# Patient Record
Sex: Male | Born: 1940 | Race: White | Hispanic: No | Marital: Married | Smoking: Former smoker
Health system: Southern US, Community
[De-identification: ages and names within clinical notes are randomized; demographics above are authoritative.]

## PROBLEM LIST (undated history)

## (undated) DIAGNOSIS — I10 Essential (primary) hypertension: Secondary | ICD-10-CM

## (undated) DIAGNOSIS — E785 Hyperlipidemia, unspecified: Secondary | ICD-10-CM

## (undated) HISTORY — DX: Essential (primary) hypertension: I10

## (undated) HISTORY — DX: Hyperlipidemia, unspecified: E78.5

## (undated) HISTORY — PX: BLADDER SURGERY: SHX569

## (undated) HISTORY — PX: RETINAL DETACHMENT SURGERY: SHX105

## (undated) HISTORY — PX: CHOLECYSTECTOMY: SHX55

## (undated) HISTORY — PX: HERNIA REPAIR: SHX51

## (undated) HISTORY — PX: APPENDECTOMY: SHX54

## (undated) HISTORY — PX: FOOT SURGERY: SHX648

---

## 2017-06-18 ENCOUNTER — Ambulatory Visit (INDEPENDENT_AMBULATORY_CARE_PROVIDER_SITE_OTHER): Payer: Medicare Other | Admitting: Vascular Surgery

## 2017-06-18 ENCOUNTER — Encounter (INDEPENDENT_AMBULATORY_CARE_PROVIDER_SITE_OTHER): Payer: Self-pay | Admitting: Vascular Surgery

## 2017-06-18 VITALS — BP 124/82 | HR 88 | Resp 17 | Ht 68.0 in | Wt 159.0 lb

## 2017-06-18 DIAGNOSIS — I83811 Varicose veins of right lower extremities with pain: Secondary | ICD-10-CM | POA: Diagnosis not present

## 2017-06-18 DIAGNOSIS — E785 Hyperlipidemia, unspecified: Secondary | ICD-10-CM | POA: Diagnosis not present

## 2017-06-18 DIAGNOSIS — I10 Essential (primary) hypertension: Secondary | ICD-10-CM | POA: Diagnosis not present

## 2017-06-18 NOTE — Assessment & Plan Note (Signed)
See plan as below 

## 2017-06-18 NOTE — Assessment & Plan Note (Signed)
lipid control important in reducing the progression of atherosclerotic disease. Continue statin therapy  

## 2017-06-18 NOTE — Progress Notes (Signed)
Patient ID: Lance Mcclure, male   DOB: 1941/11/22, 76 y.o.   MRN: [id number]  Chief Complaint  Patient presents with  . New Evaluation    varicose veins    HPI Lance Mcclure is a 76 y.o. male. The patient presents with complaints of symptomatic varicosities of the Right leg. The patient reports a long standing history of varicosities and they have become painful over time. There was no clear inciting event or causative factor that started the symptoms.  The right leg is more severly affected. The patient elevates the legs for relief. The pain is described as stinging and burning particularly around the inside of the knee and medial calf area. The symptoms are generally most severe in the evening, particularly when they have been on their feet for long periods of time. Anti-inflammatories and elevation have been used to try to improve the symptoms with limited success. The patient complains of right leg swelling as an associated symptom. The patient has no previous history of deep venous thrombosis or superficial thrombophlebitis to their knowledge. His left leg really does not have much in way of symptoms and his varicose veins are quite mild.     Past Medical History:  Diagnosis Date  . Hyperlipidemia   . Hypertension     Past Surgical History:  Procedure Laterality Date  . BLADDER SURGERY    . CHOLECYSTECTOMY    . FOOT SURGERY Left   . HERNIA REPAIR    . RETINAL DETACHMENT SURGERY      Family History  Problem Relation Age of Onset  . Cancer Mother   . Stroke Father     Social History Social History  Substance Use Topics  . Smoking status: Former Games developer  . Smokeless tobacco: Never Used  . Alcohol use No    No Known Allergies  Current Outpatient Prescriptions  Medication Sig Dispense Refill  . Biotin 5 MG TABS Take by mouth daily.    . calcium citrate-vitamin D (CITRACAL+D) 315-200 MG-UNIT tablet Take 1 tablet by mouth daily.     Marland Kitchen co-enzyme Q-10 30 MG capsule Take  200 mg by mouth daily.    . Cyanocobalamin (VITAMIN B 12 PO) Take by mouth daily.    Marland Kitchen glucosamine-chondroitin 500-400 MG tablet Take 1 tablet by mouth daily.     Marland Kitchen lisinopril (PRINIVIL,ZESTRIL) 40 MG tablet Take 40 mg by mouth daily.    . Multiple Vitamins-Minerals (MULTIVITAMIN ADULTS 50+ PO) Take by mouth daily.    . Omega-3 Fatty Acids (FISH OIL) 1200 MG CAPS Take by mouth daily.    . simvastatin (ZOCOR) 20 MG tablet Take 20 mg by mouth at bedtime.     No current facility-administered medications for this visit.       REVIEW OF SYSTEMS (Negative unless checked)  Constitutional: [] Weight loss  [] Fever  [] Chills Cardiac: [] Chest pain   [] Chest pressure   [] Palpitations   [] Shortness of breath when laying flat   [] Shortness of breath at rest   [] Shortness of breath with exertion. Vascular:  [] Pain in legs with walking   [] Pain in legs at rest   [] Pain in legs when laying flat   [] Claudication   [] Pain in feet when walking  [] Pain in feet at rest  [] Pain in feet when laying flat   [] History of DVT   [] Phlebitis   [x] Swelling in legs   [x] Varicose veins   [] Non-healing ulcers Pulmonary:   [] Uses home oxygen   [] Productive cough   [] Hemoptysis   []   Wheeze  [] COPD   [] Asthma Neurologic:  [] Dizziness  [] Blackouts   [] Seizures   [] History of stroke   [] History of TIA  [] Aphasia   [] Temporary blindness   [] Dysphagia   [] Weakness or numbness in arms   [] Weakness or numbness in legs Musculoskeletal:  [] Arthritis   [] Joint swelling   [] Joint pain   [] Low back pain Hematologic:  [] Easy bruising  [] Easy bleeding   [] Hypercoagulable state   [] Anemic  [] Hepatitis Gastrointestinal:  [] Blood in stool   [] Vomiting blood  [] Gastroesophageal reflux/heartburn   [] Abdominal pain Genitourinary:  [] Chronic kidney disease   [] Difficult urination  [] Frequent urination  [] Burning with urination   [] Hematuria Skin:  [] Rashes   [] Ulcers   [] Wounds Psychological:  [] History of anxiety   []  History of major  depression.    Physical Exam BP 124/82   Pulse 88   Resp 17   Ht 5\' 8"  (1.727 m)   Wt 72.1 kg (159 lb)   BMI 24.18 kg/m  Gen:  WD/WN, NAD. Appears younger than stated age Head: [place]/AT, No temporalis wasting.  Ear/Nose/Throat: Hearing grossly intact, dentition Good Eyes: Sclera non-icteric. Conjunctiva clear Neck: Supple, no nuchal rigidity. Trachea midline Pulmonary:  Good air movement, no use of accessory muscles, respirations not labored.  Cardiac: RRR, No JVD Vascular: Varicosities extensive and measuring up to 4 mm in the right lower extremity        Varicosities scattered and measuring up to 2 mm in the left lower extremity Vessel Right Left  Radial Palpable Palpable  Ulnar Palpable Palpable  Brachial Palpable Palpable  Carotid Palpable, without bruit Palpable, without bruit  Aorta Not palpable N/A  Femoral Palpable Palpable  Popliteal Palpable Palpable  PT Palpable Palpable  DP Palpable Palpable    Musculoskeletal: M/S 5/5 throughout.   1 + RLE edema.  No LLE edema Neurologic: Sensation grossly intact in extremities.  Symmetrical.  Speech is fluent.  Psychiatric: Judgment intact, Mood & affect appropriate for pt's clinical situation. Dermatologic: No rashes or ulcers noted.  No cellulitis or open wounds.    Radiology No results found.  Labs No results found for this or any previous visit (from the past 2160 hour(s)).  Assessment/Plan:  Essential hypertension, benign blood pressure control important in reducing the progression of atherosclerotic disease. On appropriate oral medications.   Hyperlipidemia lipid control important in reducing the progression of atherosclerotic disease. Continue statin therapy   Varicose veins of leg with pain, right See plan as below    The patient has symptoms consistent with chronic venous insufficiency. We discussed the natural history and treatment options for venous disease. I recommended the regular use of 20 - 30 mm  Hg compression stockings, and prescribed these today. I recommended leg elevation and anti-inflammatories as needed for pain. I have also recommended a complete venous duplex to assess the venous system for reflux or thrombotic issues. This can be done at the patient's convenience. I will see the patient back in 3 months to assess the response to conservative management, and determine further treatment options.     Festus BarrenJason Elio Haden 06/18/2017, 9:41 AM   This note was created with Dragon medical transcription system.  Any errors from dictation are unintentional.

## 2017-06-18 NOTE — Assessment & Plan Note (Signed)
blood pressure control important in reducing the progression of atherosclerotic disease. On appropriate oral medications.  

## 2017-09-20 ENCOUNTER — Ambulatory Visit (INDEPENDENT_AMBULATORY_CARE_PROVIDER_SITE_OTHER): Payer: Medicare Other | Admitting: Vascular Surgery

## 2017-09-20 ENCOUNTER — Encounter (INDEPENDENT_AMBULATORY_CARE_PROVIDER_SITE_OTHER): Payer: Medicare Other

## 2017-11-05 ENCOUNTER — Ambulatory Visit (INDEPENDENT_AMBULATORY_CARE_PROVIDER_SITE_OTHER): Payer: Medicare Other

## 2017-11-05 ENCOUNTER — Encounter (INDEPENDENT_AMBULATORY_CARE_PROVIDER_SITE_OTHER): Payer: Self-pay | Admitting: Vascular Surgery

## 2017-11-05 ENCOUNTER — Encounter (INDEPENDENT_AMBULATORY_CARE_PROVIDER_SITE_OTHER): Payer: Self-pay

## 2017-11-05 ENCOUNTER — Ambulatory Visit (INDEPENDENT_AMBULATORY_CARE_PROVIDER_SITE_OTHER): Payer: Medicare Other | Admitting: Vascular Surgery

## 2017-11-05 VITALS — BP 125/70 | HR 54 | Resp 16 | Wt 160.0 lb

## 2017-11-05 DIAGNOSIS — I10 Essential (primary) hypertension: Secondary | ICD-10-CM | POA: Diagnosis not present

## 2017-11-05 DIAGNOSIS — E785 Hyperlipidemia, unspecified: Secondary | ICD-10-CM | POA: Diagnosis not present

## 2017-11-05 DIAGNOSIS — I83811 Varicose veins of right lower extremities with pain: Secondary | ICD-10-CM

## 2017-11-05 NOTE — Patient Instructions (Signed)
Varicose Vein Surgery, Care After Refer to this sheet in the next few weeks. These instructions provide you with information about caring for yourself after your procedure. Your health care provider may also give you more specific instructions. Your treatment has been planned according to current medical practices, but problems sometimes occur. Call your health care provider if you have any problems or questions after your procedure. What can I expect after the procedure? After your procedure, it is typical to have the following:  Swelling.  Bruising.  Soreness.  Mild skin discoloration.  Slight bleeding at incision sites.  Follow these instructions at home:  Take medicines only as directed by your health care provider.  Wear compression stockings as directed by your health care provider. These stockings help to prevent blood clots and reduce swelling in your legs.  There are many different ways to close and cover an incision, including stitches (sutures), skin glue, and adhesive strips. Follow your health care provider's instructions on: ? Incision care. ? Bandage (dressing) changes and removal. ? Incision closure removal.  Wear loose-fitting clothing.  Get regular daily exercise. Walk or ride a stationary bike daily or as directed by your health care provider.  Ask your health care provider when you can return to work. This may depend on the type of work you do.  Be patient with your recovery. It can take up to 4 weeks to get back to your usual activities. Contact a health care provider if:  You have a fever.  You have drainage, redness, swelling, or pain at an incision site.  You develop a cough. Get help right away if:  You pass out.  You have very bad pain in your leg.  You have leg pain that gets worse when you walk.  You have redness or swelling in your leg that is getting worse.  You have trouble breathing.  You cough up blood. This information is not  intended to replace advice given to you by your health care provider. Make sure you discuss any questions you have with your health care provider. Document Released: 08/06/2014 Document Revised: 05/10/2016 Document Reviewed: 05/12/2014 Elsevier Interactive Patient Education  2018 [employer] Inc[employer]  

## 2017-11-05 NOTE — Progress Notes (Signed)
Patient ID: Lance Mcclure, male   DOB: [birth year], 76 y.o.   MRN: [id number]  Chief Complaint  Patient presents with  . Follow-up    22mo bil ven reflux    HPI Lance Mcclure is a 76 y.o. male.  Patient returns in follow up of their venous disease.  They have done their best to comply with the prescribed conservative therapies of compression stockings, leg elevation, exercise, and still requires anti-inflammatories for discomfort and has symptoms that are persistent and bothersome on a daily basis, affecting their activities of daily living and normal activities.  The venous reflux study demonstrates no DVT or superficial thrombophlebitis.  Right great saphenous vein reflux was seen throughout the leg.         Past Medical History:  Diagnosis Date  . Hyperlipidemia   . Hypertension          Past Surgical History:  Procedure Laterality Date  . BLADDER SURGERY    . CHOLECYSTECTOMY    . FOOT SURGERY Left   . HERNIA REPAIR    . RETINAL DETACHMENT SURGERY           Family History  Problem Relation Age of Onset  . Cancer Mother   . Stroke Father     Social History     Social History  Substance Use Topics  . Smoking status: Former Games developermoker  . Smokeless tobacco: Never Used  . Alcohol use No    No Known Allergies        Current Outpatient Prescriptions  Medication Sig Dispense Refill  . Biotin 5 MG TABS Take by mouth daily.    . calcium citrate-vitamin D (CITRACAL+D) 315-200 MG-UNIT tablet Take 1 tablet by mouth daily.     Marland Kitchen. co-enzyme Q-10 30 MG capsule Take 200 mg by mouth daily.    . Cyanocobalamin (VITAMIN B 12 PO) Take by mouth daily.    Marland Kitchen. glucosamine-chondroitin 500-400 MG tablet Take 1 tablet by mouth daily.     Marland Kitchen. lisinopril (PRINIVIL,ZESTRIL) 40 MG tablet Take 40 mg by mouth daily.    . Multiple Vitamins-Minerals (MULTIVITAMIN ADULTS 50+ PO) Take by mouth daily.    . Omega-3 Fatty Acids (FISH OIL) 1200 MG CAPS Take by mouth  daily.    . simvastatin (ZOCOR) 20 MG tablet Take 20 mg by mouth at bedtime.     No current facility-administered medications for this visit.       REVIEW OF SYSTEMS (Negative unless checked)  Constitutional: [] Weight loss  [] Fever  [] Chills Cardiac: [] Chest pain   [] Chest pressure   [] Palpitations   [] Shortness of breath when laying flat   [] Shortness of breath at rest   [] Shortness of breath with exertion. Vascular:  [] Pain in legs with walking   [] Pain in legs at rest   [] Pain in legs when laying flat   [] Claudication   [] Pain in feet when walking  [] Pain in feet at rest  [] Pain in feet when laying flat   [] History of DVT   [] Phlebitis   [x] Swelling in legs   [x] Varicose veins   [] Non-healing ulcers Pulmonary:   [] Uses home oxygen   [] Productive cough   [] Hemoptysis   [] Wheeze  [] COPD   [] Asthma Neurologic:  [] Dizziness  [] Blackouts   [] Seizures   [] History of stroke   [] History of TIA  [] Aphasia   [] Temporary blindness   [] Dysphagia   [] Weakness or numbness in arms   [] Weakness or numbness in legs Musculoskeletal:  [] Arthritis   [] Joint swelling   []   Joint pain   [] Low back pain Hematologic:  [] Easy bruising  [] Easy bleeding   [] Hypercoagulable state   [] Anemic  [] Hepatitis Gastrointestinal:  [] Blood in stool   [] Vomiting blood  [] Gastroesophageal reflux/heartburn   [] Abdominal pain Genitourinary:  [] Chronic kidney disease   [] Difficult urination  [] Frequent urination  [] Burning with urination   [] Hematuria Skin:  [] Rashes   [] Ulcers   [] Wounds Psychological:  [] History of anxiety   []  History of major depression.       Physical Exam BP 125/70 (BP Location: Right Arm)   Pulse (!) 54   Resp 16   Wt 72.6 kg (160 lb)   BMI 24.33 kg/m  Gen:  WD/WN, NAD Head: [place]/AT, No temporalis wasting.  Ear/Nose/Throat: Hearing grossly intact, dentition good Eyes: Sclera non-icteric. Conjunctiva clear Neck: Supple. Trachea midline Pulmonary:  Good air movement, no use of accessory  muscles, respirations not labored.  Cardiac: RRR, No JVD Vascular: Varicosities diffuse and measuring up to 3 mm in the right lower extremity particularly in the medial knee and posterior calf area        Varicosities scattered and measuring up to 1-2 mm in the left lower extremity Vessel Right Left  Radial Palpable Palpable  Ulnar Palpable Palpable  Brachial Palpable Palpable  Carotid Palpable, without bruit Palpable, without bruit  Aorta Not palpable N/A  Femoral Palpable Palpable  Popliteal Palpable Palpable  PT Palpable Palpable  DP Palpable Palpable    Musculoskeletal: M/S 5/5 throughout.   Trace RLE edema.  No LLE edema Neurologic: Sensation grossly intact in extremities.  Symmetrical.  Speech is fluent.  Psychiatric: Judgment intact, Mood & affect appropriate for pt's clinical situation. Dermatologic: No rashes or ulcers noted.  No cellulitis or open wounds.    Radiology No results found.  Labs No results found for this or any previous visit (from the past 2160 hour(s)).  Assessment/Plan: Essential hypertension, benign blood pressure control important in reducing the progression of atherosclerotic disease. On appropriate oral medications.   Hyperlipidemia lipid control important in reducing the progression of atherosclerotic disease. Continue statin therapy    Varicose veins of leg with pain, right     The patient has done their best to comply with conservative therapy of 20-30 mm Hg compression stockings, leg elevation, exercise, and anti-inflammatories as needed for discomfort.  Despite this, they continue to have daily and persistent symptoms from their venous disease.  A venous reflux study demonstrates no DVT or superficial thrombophlebitis.  Right great saphenous vein reflux was seen throughout the leg.  As such, the patient is likely to benefit from endovenous laser ablation of the right great saphenous vein.  Risks and benefits of the procedure including  bleeding, infection, recanalization, DVT, and need for further therapy for residual varicosities were discussed.  The patient voices their understanding and is agreeable to proceed with right great saphenous vein.     Festus BarrenJason Dew 11/05/2017, 11:44 AM

## 2018-02-18 ENCOUNTER — Encounter (INDEPENDENT_AMBULATORY_CARE_PROVIDER_SITE_OTHER): Payer: Self-pay | Admitting: Vascular Surgery

## 2018-02-18 ENCOUNTER — Ambulatory Visit (INDEPENDENT_AMBULATORY_CARE_PROVIDER_SITE_OTHER): Payer: Medicare Other | Admitting: Vascular Surgery

## 2018-02-18 VITALS — BP 113/64 | HR 50 | Resp 16 | Wt 160.0 lb

## 2018-02-18 DIAGNOSIS — I83811 Varicose veins of right lower extremities with pain: Secondary | ICD-10-CM

## 2018-02-18 NOTE — Progress Notes (Signed)
Varicose veins of leg with pain, right    The patient's right lower extremity was sterilely prepped and draped. The ultrasound machine was used to visualize the saphenous vein throughout its course. A segment around the knee was selected for access. The saphenous vein was accessed at the level of the knee difficulty using ultrasound guidance with a micro puncture needle. A micro puncture wire and sheath were then placed. A 0.018 wire was placed beyond the saphenofemoral junction through the sheath and the micro puncture sheath was removed. The 65 cm sheath was then placed over the wire and the wire and dilator were removed. The laser fiber was placed through the sheath and its tip was placed approximately 4 cm below the saphenofemoral junction. Tumescent anesthesia was then created with a dilute lidocaine solution. Laser energy was then delivered with constant withdrawal of the sheath and laser fiber. Approximately 1159 Joules of energy were delivered over a length of 28 cm using the 1470 Hz [employer] machine at [employer]. Sterile dressings were placed. The patient tolerated the procedure well without complications.

## 2018-02-21 ENCOUNTER — Ambulatory Visit (INDEPENDENT_AMBULATORY_CARE_PROVIDER_SITE_OTHER): Payer: Medicare Other

## 2018-02-21 DIAGNOSIS — I83811 Varicose veins of right lower extremities with pain: Secondary | ICD-10-CM | POA: Diagnosis not present

## 2018-03-14 ENCOUNTER — Ambulatory Visit (INDEPENDENT_AMBULATORY_CARE_PROVIDER_SITE_OTHER): Payer: Medicare Other | Admitting: Vascular Surgery

## 2018-03-14 ENCOUNTER — Encounter (INDEPENDENT_AMBULATORY_CARE_PROVIDER_SITE_OTHER): Payer: Self-pay | Admitting: Vascular Surgery

## 2018-03-14 VITALS — BP 152/84 | HR 66 | Resp 12 | Ht 67.0 in | Wt 159.0 lb

## 2018-03-14 DIAGNOSIS — I890 Lymphedema, not elsewhere classified: Secondary | ICD-10-CM | POA: Diagnosis not present

## 2018-03-14 DIAGNOSIS — I83811 Varicose veins of right lower extremities with pain: Secondary | ICD-10-CM | POA: Diagnosis not present

## 2018-03-14 DIAGNOSIS — I872 Venous insufficiency (chronic) (peripheral): Secondary | ICD-10-CM | POA: Insufficient documentation

## 2018-03-14 NOTE — Progress Notes (Signed)
Subjective:    Patient ID: Lance Mcclure, male    DOB: 1941-05-23, 77 y.o.   MRN: [id number] Chief Complaint  Patient presents with  . Follow-up    3-4 week post laser   The patient presents for his first post procedure follow-up.  The patient is status post right greater saphenous vein ablation on February 18, 2018.  The patient is still experiencing some soreness to the right lower extremity.  Since his initial visit, the patient has been engaging in conservative therapy included wearing medical grade 1 compression socks and elevating his legs on a daily basis.  The patient has noted some improvement to the discomfort along his varicosities since undergoing laser ablation however the majority of his symptoms do remain.  The patient notes the discomfort along his varicosities have progressed to the point he is unable to function on a daily basis.  The patient notes his symptoms are lifestyle limiting.  The patient denies any fever, nausea vomiting.  Review of Systems  Constitutional: Negative.   HENT: Negative.   Eyes: Negative.   Respiratory: Negative.   Cardiovascular:       Painful varicose veins  Gastrointestinal: Negative.   Endocrine: Negative.   Genitourinary: Negative.   Musculoskeletal: Negative.   Skin: Negative.   Allergic/Immunologic: Negative.   Neurological: Negative.   Hematological: Negative.   Psychiatric/Behavioral: Negative.       Objective:   Physical Exam  Constitutional: He is oriented to person, place, and time. He appears well-developed and well-nourished. No distress.  HENT:  Head: Normocephalic and atraumatic.  Eyes: Pupils are equal, round, and reactive to light. Conjunctivae are normal.  Neck: Normal range of motion.  Cardiovascular: Normal rate, regular rhythm, normal heart sounds and intact distal pulses.  Pulses:      Radial pulses are 2+ on the right side, and 2+ on the left side.       Dorsalis pedis pulses are 2+ on the right side, and 2+ on the  left side.       Posterior tibial pulses are 2+ on the right side, and 2+ on the left side.  Pulmonary/Chest: Effort normal and breath sounds normal.  Musculoskeletal: Normal range of motion. He exhibits edema (Mild right lower extremity edema).  Neurological: He is alert and oriented to person, place, and time.  Skin: He is not diaphoretic.  Mixture of greater than 1 cm and less than 1 cm diffuse varicosities noted to the right lower extremity.  There is mild stasis dermatitis.  There is no skin changes or cellulitis noted.  Psychiatric: He has a normal mood and affect. His behavior is normal. Judgment and thought content normal.  Vitals reviewed.  BP (!) 152/84 (BP Location: Right Arm, Patient Position: Sitting)   Pulse 66   Resp 12   Ht 5\' 7"  (1.702 m)   Wt 159 lb (72.1 kg)   BMI 24.90 kg/m   Past Medical History:  Diagnosis Date  . Hyperlipidemia   . Hypertension    Social History   Socioeconomic History  . Marital status: Married    Spouse name: Not on file  . Number of children: Not on file  . Years of education: Not on file  . Highest education level: Not on file  Occupational History  . Not on file  Social Needs  . Financial resource strain: Not on file  . Food insecurity:    Worry: Not on file    Inability: Not on file  .  Transportation needs:    Medical: Not on file    Non-medical: Not on file  Tobacco Use  . Smoking status: Former Games developermoker  . Smokeless tobacco: Never Used  Substance and Sexual Activity  . Alcohol use: No  . Drug use: No  . Sexual activity: Not on file  Lifestyle  . Physical activity:    Days per week: Not on file    Minutes per session: Not on file  . Stress: Not on file  Relationships  . Social connections:    Talks on phone: Not on file    Gets together: Not on file    Attends religious service: Not on file    Active member of club or organization: Not on file    Attends meetings of clubs or organizations: Not on file     Relationship status: Not on file  . Intimate partner violence:    Fear of current or ex partner: Not on file    Emotionally abused: Not on file    Physically abused: Not on file    Forced sexual activity: Not on file  Other Topics Concern  . Not on file  Social History Narrative  . Not on file   Past Surgical History:  Procedure Laterality Date  . BLADDER SURGERY    . CHOLECYSTECTOMY    . FOOT SURGERY Left   . HERNIA REPAIR    . RETINAL DETACHMENT SURGERY     Family History  Problem Relation Age of Onset  . Cancer Mother   . Stroke Father    No Known Allergies     Assessment & Plan:  The patient presents for his first post procedure follow-up.  The patient is status post right greater saphenous vein ablation on February 18, 2018.  The patient is still experiencing some soreness to the right lower extremity.  Since his initial visit, the patient has been engaging in conservative therapy included wearing medical grade 1 compression socks and elevating his legs on a daily basis.  The patient has noted some improvement to the discomfort along his varicosities since undergoing laser ablation however the majority of his symptoms do remain.  The patient notes the discomfort along his varicosities have progressed to the point he is unable to function on a daily basis.  The patient notes his symptoms are lifestyle limiting.  The patient denies any fever, nausea vomiting.  1. Chronic venous insufficiency - New The patient is status post successful ablation of the right greater saphenous vein on February 18, 2018  2. Lymphedema - New At this time, the patient is engaging in conservative therapy including wearing medical grade 1 compression stockings and elevating his legs on a daily basis The patient may be a candidate for lymphedema pump in the future  3. Varicose veins of leg with pain, right - Stable The patient has not seen a significant improvement to the discomfort along his varicosities to  the right lower extremity status post successful laser ablation to the great saphenous vein. The patient would greatly benefit from saline/foam sclerotherapy into the evening varicosities I will applied to the patient's insurance The patient will continue engaging in conservative therapy until I receive insurance approval  Current Outpatient Medications on File Prior to Visit  Medication Sig Dispense Refill  . Biotin 5 MG TABS Take by mouth daily.    . calcium citrate-vitamin D (CITRACAL+D) 315-200 MG-UNIT tablet Take 1 tablet by mouth daily.     . calcium-vitamin D (OSCAL WITH D)  500-200 MG-UNIT TABS tablet Take by mouth.    . co-enzyme Q-10 30 MG capsule Take 200 mg by mouth daily.    . Cyanocobalamin (VITAMIN B 12 PO) Take by mouth daily.    Marland Kitchen glucosamine-chondroitin 500-400 MG tablet Take 1 tablet by mouth daily.     Marland Kitchen lisinopril (PRINIVIL,ZESTRIL) 40 MG tablet Take 40 mg by mouth daily.    . Multiple Vitamin (MULTI-VITAMINS) TABS Take by mouth.    . Multiple Vitamins-Minerals (MULTIVITAMIN ADULTS 50+ PO) Take by mouth daily.    . Omega-3 Fatty Acids (FISH OIL) 1200 MG CAPS Take by mouth daily.    . simvastatin (ZOCOR) 20 MG tablet Take 20 mg by mouth at bedtime.     No current facility-administered medications on file prior to visit.    There are no Patient Instructions on file for this visit. No follow-ups on file.  Sadie Hazelett A Chavon Lucarelli, PA-C

## 2018-04-14 ENCOUNTER — Encounter (INDEPENDENT_AMBULATORY_CARE_PROVIDER_SITE_OTHER): Payer: Self-pay | Admitting: Vascular Surgery

## 2018-04-14 ENCOUNTER — Ambulatory Visit (INDEPENDENT_AMBULATORY_CARE_PROVIDER_SITE_OTHER): Payer: Medicare Other | Admitting: Vascular Surgery

## 2018-04-14 VITALS — BP 128/62 | HR 62 | Resp 16 | Ht 67.0 in | Wt 160.0 lb

## 2018-04-14 DIAGNOSIS — I83811 Varicose veins of right lower extremities with pain: Secondary | ICD-10-CM

## 2018-04-14 NOTE — Progress Notes (Signed)
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   Procedure: Sclerotherapy using hypertonic saline mixed with 1% Lidocaine was performed on the right lower extremity. Compression wraps were placed. The patient tolerated the procedure well. 

## 2018-05-19 ENCOUNTER — Ambulatory Visit (INDEPENDENT_AMBULATORY_CARE_PROVIDER_SITE_OTHER): Payer: Medicare Other | Admitting: Vascular Surgery

## 2018-05-19 ENCOUNTER — Encounter (INDEPENDENT_AMBULATORY_CARE_PROVIDER_SITE_OTHER): Payer: Self-pay | Admitting: Vascular Surgery

## 2018-05-19 VITALS — BP 154/87 | HR 72 | Resp 13 | Ht 66.5 in | Wt 158.0 lb

## 2018-05-19 DIAGNOSIS — I83811 Varicose veins of right lower extremities with pain: Secondary | ICD-10-CM | POA: Diagnosis not present

## 2018-05-19 NOTE — Progress Notes (Signed)
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   Procedure: Sclerotherapy using hypertonic saline mixed with 1% Lidocaine was performed on the right lower extremity. Compression wraps were placed. The patient tolerated the procedure well. 

## 2018-07-14 ENCOUNTER — Ambulatory Visit (INDEPENDENT_AMBULATORY_CARE_PROVIDER_SITE_OTHER): Payer: Medicare Other | Admitting: Vascular Surgery

## 2018-07-14 ENCOUNTER — Encounter (INDEPENDENT_AMBULATORY_CARE_PROVIDER_SITE_OTHER): Payer: Self-pay

## 2018-07-14 ENCOUNTER — Encounter (INDEPENDENT_AMBULATORY_CARE_PROVIDER_SITE_OTHER): Payer: Self-pay | Admitting: Vascular Surgery

## 2018-07-14 VITALS — BP 140/77 | HR 77 | Resp 16 | Ht 66.5 in | Wt 158.8 lb

## 2018-07-14 DIAGNOSIS — I83811 Varicose veins of right lower extremities with pain: Secondary | ICD-10-CM | POA: Diagnosis not present

## 2018-07-14 NOTE — Progress Notes (Signed)
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   Procedure: Sclerotherapy using hypertonic saline mixed with 1% Lidocaine was performed on the right lower extremity. Compression wraps were placed. The patient tolerated the procedure well. 

## 2019-07-21 ENCOUNTER — Inpatient Hospital Stay
Admission: EM | Admit: 2019-07-21 | Discharge: 2019-07-24 | DRG: 390 | Disposition: A | Discharge status: Home / Self Care | Payer: Medicare Other | Attending: Surgery | Admitting: Surgery

## 2019-07-21 ENCOUNTER — Other Ambulatory Visit: Payer: Self-pay

## 2019-07-21 DIAGNOSIS — Z20828 Contact with and (suspected) exposure to other viral communicable diseases: Secondary | ICD-10-CM | POA: Diagnosis present

## 2019-07-21 DIAGNOSIS — E785 Hyperlipidemia, unspecified: Secondary | ICD-10-CM | POA: Diagnosis present

## 2019-07-21 DIAGNOSIS — K56609 Unspecified intestinal obstruction, unspecified as to partial versus complete obstruction: Secondary | ICD-10-CM | POA: Diagnosis present

## 2019-07-21 DIAGNOSIS — I10 Essential (primary) hypertension: Secondary | ICD-10-CM | POA: Diagnosis present

## 2019-07-21 DIAGNOSIS — Z9049 Acquired absence of other specified parts of digestive tract: Secondary | ICD-10-CM

## 2019-07-21 DIAGNOSIS — I700 Atherosclerosis of aorta: Secondary | ICD-10-CM | POA: Diagnosis present

## 2019-07-21 DIAGNOSIS — Z87891 Personal history of nicotine dependence: Secondary | ICD-10-CM

## 2019-07-21 DIAGNOSIS — Z0189 Encounter for other specified special examinations: Secondary | ICD-10-CM

## 2019-07-21 DIAGNOSIS — Z79899 Other long term (current) drug therapy: Secondary | ICD-10-CM

## 2019-07-21 DIAGNOSIS — K5669 Other partial intestinal obstruction: Secondary | ICD-10-CM | POA: Diagnosis not present

## 2019-07-21 DIAGNOSIS — M47816 Spondylosis without myelopathy or radiculopathy, lumbar region: Secondary | ICD-10-CM | POA: Diagnosis present

## 2019-07-21 LAB — COMPREHENSIVE METABOLIC PANEL
ALT: 22 U/L (ref 0–44)
AST: 25 U/L (ref 15–41)
Albumin: 4.8 g/dL (ref 3.5–5.0)
Alkaline Phosphatase: 38 U/L (ref 38–126)
Anion gap: 14 (ref 5–15)
BUN: 20 mg/dL (ref 8–23)
CO2: 24 mmol/L (ref 22–32)
Calcium: 9.5 mg/dL (ref 8.9–10.3)
Chloride: 102 mmol/L (ref 98–111)
Creatinine, Ser: 1.17 mg/dL (ref 0.61–1.24)
GFR calc Af Amer: >60 mL/min (ref >60)
GFR calc non Af Amer: >60 mL/min (ref >60)
Glucose, Bld: 124 mg/dL — ABNORMAL HIGH (ref 70–99)
Potassium: 4 mmol/L (ref 3.5–5.1)
Sodium: 140 mmol/L (ref 135–145)
Total Bilirubin: 1.1 mg/dL (ref 0.3–1.2)
Total Protein: 7.4 g/dL (ref 6.5–8.1)

## 2019-07-21 LAB — LIPASE, BLOOD: Lipase: 29 U/L (ref 11–51)

## 2019-07-21 LAB — CBC
HCT: 43.3 % (ref 39.0–52.0)
Hemoglobin: 14.8 g/dL (ref 13.0–17.0)
MCH: 31.8 pg (ref 26.0–34.0)
MCHC: 34.2 g/dL (ref 30.0–36.0)
MCV: 93.1 fL (ref 80.0–100.0)
Platelets: 236 10*3/uL (ref 150–400)
RBC: 4.65 MIL/uL (ref 4.22–5.81)
RDW: 13.1 % (ref 11.5–15.5)
WBC: 14.3 10*3/uL — ABNORMAL HIGH (ref 4.0–10.5)
nRBC: 0 % (ref 0.0–0.2)

## 2019-07-21 MED ORDER — ONDANSETRON 4 MG PO TBDP
ORAL_TABLET | ORAL | Status: AC
  Filled 2019-07-21: qty 1

## 2019-07-21 MED ORDER — ONDANSETRON 4 MG PO TBDP
4.0000 mg | ORAL_TABLET | Freq: Once | ORAL | Status: AC | PRN
  Administered 2019-07-21: 22:00:00 4 mg via ORAL

## 2019-07-21 NOTE — ED Triage Notes (Signed)
Pt arrives to ED via POV from home with c/o abdominal pain and N/V since 4pm today. Pt reports the abdominal is generalized without radiation. Pt states the s/x's started after eating at the [employer], having some soup and states he has eaten onions and concerned d/t the recent GI outbreak associated with onions in the news. Pt reports 5 episodes on non-bloody emesis since the s/x's started. Pt reports having his gall bladder and appendix surgically removed in the past.

## 2019-07-22 ENCOUNTER — Inpatient Hospital Stay: Payer: Medicare Other

## 2019-07-22 ENCOUNTER — Other Ambulatory Visit: Payer: Self-pay

## 2019-07-22 ENCOUNTER — Emergency Department: Payer: Medicare Other

## 2019-07-22 DIAGNOSIS — K5669 Other partial intestinal obstruction: Secondary | ICD-10-CM | POA: Diagnosis present

## 2019-07-22 DIAGNOSIS — Z20828 Contact with and (suspected) exposure to other viral communicable diseases: Secondary | ICD-10-CM | POA: Diagnosis present

## 2019-07-22 DIAGNOSIS — K56609 Unspecified intestinal obstruction, unspecified as to partial versus complete obstruction: Secondary | ICD-10-CM | POA: Diagnosis present

## 2019-07-22 DIAGNOSIS — Z79899 Other long term (current) drug therapy: Secondary | ICD-10-CM | POA: Diagnosis not present

## 2019-07-22 DIAGNOSIS — Z9049 Acquired absence of other specified parts of digestive tract: Secondary | ICD-10-CM | POA: Diagnosis not present

## 2019-07-22 DIAGNOSIS — I700 Atherosclerosis of aorta: Secondary | ICD-10-CM | POA: Diagnosis present

## 2019-07-22 DIAGNOSIS — M47816 Spondylosis without myelopathy or radiculopathy, lumbar region: Secondary | ICD-10-CM | POA: Diagnosis present

## 2019-07-22 DIAGNOSIS — E785 Hyperlipidemia, unspecified: Secondary | ICD-10-CM | POA: Diagnosis present

## 2019-07-22 DIAGNOSIS — I10 Essential (primary) hypertension: Secondary | ICD-10-CM | POA: Diagnosis present

## 2019-07-22 DIAGNOSIS — Z87891 Personal history of nicotine dependence: Secondary | ICD-10-CM | POA: Diagnosis not present

## 2019-07-22 LAB — SARS CORONAVIRUS 2 (TAT 6-24 HRS): SARS Coronavirus 2: NEGATIVE

## 2019-07-22 IMAGING — DX PORTABLE ABDOMEN - 1 VIEW
1 series · 1 of 1 positions shown · non-contrast
Comparison: CT [DATE].

CLINICAL DATA: NG tube placement.

EXAM:
PORTABLE ABDOMEN - 1 VIEW

[abdomen supine]
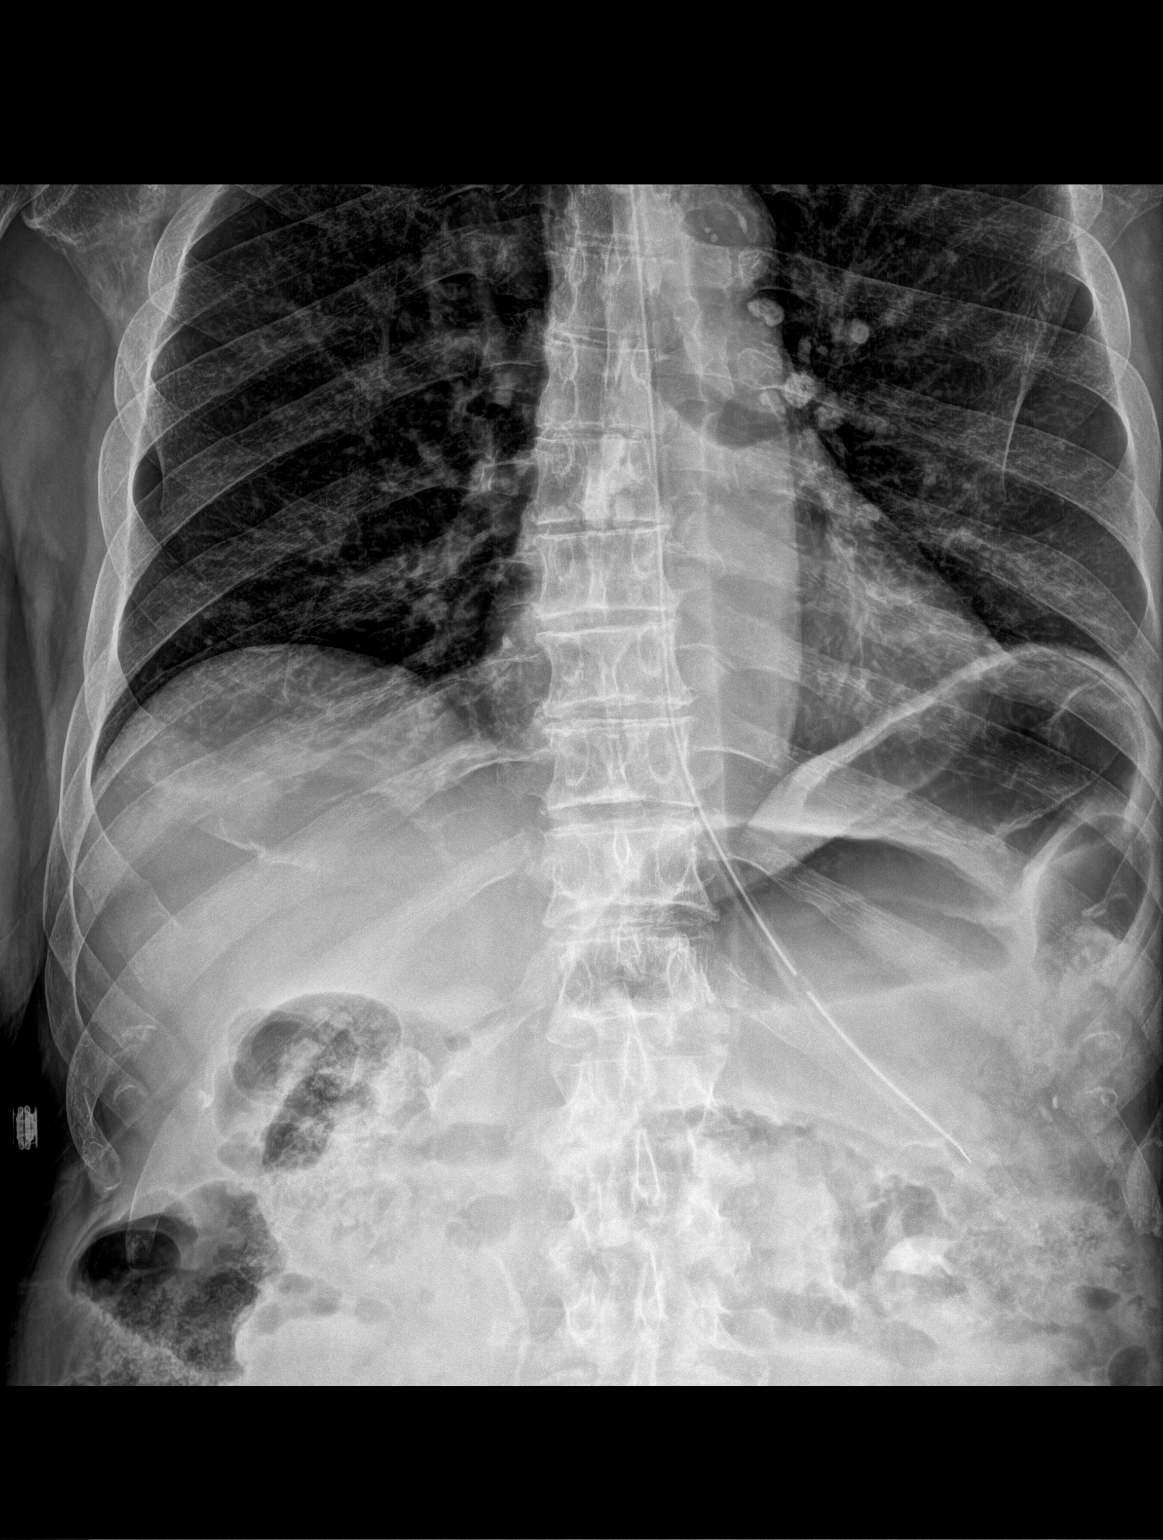

[1 of 1 positions shown; findings below may reference images not displayed]

FINDINGS: NG tube noted with its tip and side hole over the stomach. Contrast
in the kidneys from recent CT. Bowel distention present best
identified by prior CT. Calcified nodular densities in the left
perihilar region consistent with granulomas.
IMPRESSION: NG tube noted with tip and side hole over the stomach.

## 2019-07-22 MED ORDER — MORPHINE SULFATE (PF) 4 MG/ML IV SOLN
4.0000 mg | Freq: Once | INTRAVENOUS | Status: AC
  Administered 2019-07-22: 4 mg via INTRAVENOUS
  Filled 2019-07-22: qty 1

## 2019-07-22 MED ORDER — ACETAMINOPHEN 500 MG PO TABS: 500.0000 mg | ORAL_TABLET | Freq: Four times a day (QID) | ORAL | Status: DC | PRN

## 2019-07-22 MED ORDER — HYDRALAZINE HCL 20 MG/ML IJ SOLN: 10.0000 mg | INTRAMUSCULAR | Status: DC | PRN

## 2019-07-22 MED ORDER — LISINOPRIL 20 MG PO TABS: 40.0000 mg | ORAL_TABLET | Freq: Every day | ORAL | Status: DC

## 2019-07-22 MED ORDER — SODIUM CHLORIDE 0.9% FLUSH: 10.0000 mL | INTRAVENOUS | Status: DC | PRN

## 2019-07-22 MED ORDER — MORPHINE SULFATE (PF) 2 MG/ML IV SOLN
1.0000 mg | INTRAVENOUS | Status: DC | PRN
  Administered 2019-07-22 (×3): 1 mg via INTRAVENOUS
  Filled 2019-07-22 (×3): qty 1

## 2019-07-22 MED ORDER — DIATRIZOATE MEGLUMINE & SODIUM 66-10 % PO SOLN
90.0000 mL | Freq: Once | ORAL | Status: AC
  Administered 2019-07-22: 90 mL via NASOGASTRIC

## 2019-07-22 MED ORDER — MORPHINE SULFATE (PF) 4 MG/ML IV SOLN
4.0000 mg | Freq: Once | INTRAVENOUS | Status: AC
  Administered 2019-07-22: 04:00:00 4 mg via INTRAVENOUS
  Filled 2019-07-22: qty 1

## 2019-07-22 MED ORDER — LACTATED RINGERS IV SOLN
INTRAVENOUS | Status: DC
  Administered 2019-07-22 – 2019-07-23 (×3): via INTRAVENOUS

## 2019-07-22 MED ORDER — SODIUM CHLORIDE 0.9 % IV BOLUS
1000.0000 mL | Freq: Once | INTRAVENOUS | Status: AC
  Administered 2019-07-22: 01:00:00 1000 mL via INTRAVENOUS

## 2019-07-22 MED ORDER — ONDANSETRON 4 MG PO TBDP
4.0000 mg | ORAL_TABLET | Freq: Four times a day (QID) | ORAL | Status: DC | PRN
  Filled 2019-07-22: qty 1

## 2019-07-22 MED ORDER — PANTOPRAZOLE SODIUM 40 MG IV SOLR
40.0000 mg | Freq: Every day | INTRAVENOUS | Status: DC
  Administered 2019-07-22: 23:00:00 40 mg via INTRAVENOUS
  Filled 2019-07-22: qty 40

## 2019-07-22 MED ORDER — TRAMADOL HCL 50 MG PO TABS: 50.0000 mg | ORAL_TABLET | Freq: Four times a day (QID) | ORAL | Status: DC | PRN

## 2019-07-22 MED ORDER — PROMETHAZINE HCL 25 MG/ML IJ SOLN
6.2500 mg | Freq: Four times a day (QID) | INTRAMUSCULAR | Status: DC | PRN
  Administered 2019-07-22: 11:00:00 6.25 mg via INTRAVENOUS
  Filled 2019-07-22: qty 1

## 2019-07-22 MED ORDER — ONDANSETRON HCL 4 MG/2ML IJ SOLN
4.0000 mg | Freq: Once | INTRAMUSCULAR | Status: AC
  Administered 2019-07-22: 01:00:00 4 mg via INTRAVENOUS
  Filled 2019-07-22: qty 2

## 2019-07-22 MED ORDER — LISINOPRIL 20 MG PO TABS
40.0000 mg | ORAL_TABLET | Freq: Every day | ORAL | Status: DC
  Administered 2019-07-22 – 2019-07-23 (×3): 40 mg via ORAL
  Filled 2019-07-22 (×3): qty 2

## 2019-07-22 MED ORDER — IOHEXOL 300 MG/ML  SOLN
100.0000 mL | Freq: Once | INTRAMUSCULAR | Status: AC | PRN
  Administered 2019-07-22: 01:00:00 100 mL via INTRAVENOUS

## 2019-07-22 MED ORDER — ENOXAPARIN SODIUM 40 MG/0.4ML ~~LOC~~ SOLN
40.0000 mg | SUBCUTANEOUS | Status: DC
  Administered 2019-07-22 – 2019-07-24 (×3): 40 mg via SUBCUTANEOUS
  Filled 2019-07-22 (×3): qty 0.4

## 2019-07-22 MED ORDER — ONDANSETRON HCL 4 MG/2ML IJ SOLN
4.0000 mg | Freq: Four times a day (QID) | INTRAMUSCULAR | Status: DC | PRN
  Administered 2019-07-22: 08:00:00 4 mg via INTRAVENOUS
  Filled 2019-07-22: qty 2

## 2019-07-22 NOTE — ED Provider Notes (Signed)
[facility] Emergency Department Provider Note ____________________________________________   First MD Initiated Contact with Patient 07/22/19 0004     (approximate)  I have reviewed the triage vital signs and the nursing notes.   HISTORY  Chief Complaint Abdominal Pain    HPI Lance Mcclure is a 78 y.o. male with PMH as noted below who presents with nausea and vomiting, acute onset approximately 6 hours ago, persistent since then, and associated with generalized abdominal pain which she describes as constant.  The patient has not had any accompanying diarrhea.  He denies any blood in the vomitus.  The symptoms started about 2 hours after he ate a meal at [employer].  The patient is concerned that he could have Salmonella related to a recent outbreak and onions.  He has not eaten anything else unusual and states he was feeling well before this afternoon.  He has no sick contacts.  Past Medical History:  Diagnosis Date  . Hyperlipidemia   . Hypertension     Patient Active Problem List   Diagnosis Date Noted  . SBO (small bowel obstruction) ([place]) 07/22/2019  . Chronic venous insufficiency 03/14/2018  . Lymphedema 03/14/2018  . Essential hypertension, benign 06/18/2017  . Hyperlipidemia 06/18/2017  . Varicose veins of leg with pain, right 06/18/2017    Past Surgical History:  Procedure Laterality Date  . APPENDECTOMY    . BLADDER SURGERY    . CHOLECYSTECTOMY    . FOOT SURGERY Left   . HERNIA REPAIR    . RETINAL DETACHMENT SURGERY      Prior to Admission medications   Medication Sig Start Date End Date Taking? Authorizing Provider  azelastine (ASTELIN) 0.1 % nasal spray Place 1 spray into both nostrils 2 (two) times daily as needed for rhinitis or allergies.   Yes [provider]  Biotin 5 MG TABS Take 5 mg by mouth daily.    Yes [provider]  calcium-vitamin D (OSCAL WITH D) 500-200 MG-UNIT tablet Take 1 tablet by  mouth daily.   Yes [provider]  Coenzyme Q10 200 MG TABS Take 200 mg by mouth daily.    Yes [provider]  Glucosamine-Chondroit-Vit C-Mn (GLUCOSAMINE CHONDR 1500 COMPLX) CAPS Take 1 capsule by mouth daily.   Yes [provider]  lisinopril (PRINIVIL,ZESTRIL) 40 MG tablet Take 40 mg by mouth at bedtime.    Yes [provider]  Multiple Vitamins-Minerals (MULTIVITAMIN ADULTS 50+ PO) Take 1 tablet by mouth daily.    Yes [provider]  simvastatin (ZOCOR) 20 MG tablet Take 20 mg by mouth at bedtime.   Yes [provider]  vitamin B-12 (CYANOCOBALAMIN) 1000 MCG tablet Take 1,000 mcg by mouth daily.   Yes [provider]    Allergies Patient has no known allergies.  Family History  Problem Relation Age of Onset  . Cancer Mother   . Stroke Father     Social History Social History   Tobacco Use  . Smoking status: Former Research scientist (life sciences)  . Smokeless tobacco: Never Used  Substance Use Topics  . Alcohol use: No  . Drug use: No    Review of Systems  Constitutional: No fever. Eyes: No redness. ENT: No sore throat. Cardiovascular: Denies chest pain. Respiratory: Denies shortness of breath. Gastrointestinal: Positive for nausea and vomiting.  No diarrhea.  Genitourinary: Negative for dysuria.  Musculoskeletal: Negative for back pain. Skin: Negative for rash. Neurological: Negative for headache.   ____________________________________________   PHYSICAL EXAM:  VITAL SIGNS: ED Triage Vitals  Enc Vitals Group     BP 07/21/19 2213 (!) 152/74     Pulse Rate 07/21/19 2213 79     Resp 07/21/19 2213 18     Temp 07/21/19 2213 97.6 F (36.4 C)     Temp Source 07/21/19 2213 Oral     SpO2 07/21/19 2213 100 %     Weight 07/21/19 2214 150 lb (68 kg)     Height 07/21/19 2214 5\' 6"  (1.676 m)     Head Circumference --      Peak Flow --      Pain Score 07/21/19 2221 10     Pain Loc --      Pain Edu? --      Excl. in GC? --      Constitutional: Alert and oriented. Well appearing and in no acute distress. Eyes: Conjunctivae are normal.  Head: Atraumatic. Nose: No congestion/rhinnorhea. Mouth/Throat: Mucous membranes are slightly dry.   Neck: Normal range of motion.  Cardiovascular: Normal rate, regular rhythm. Good peripheral circulation. Respiratory: Normal respiratory effort.  No retractions.  Gastrointestinal: Soft with mild to moderate tenderness in all quadrants.  No peritoneal signs.  No distention.  Genitourinary: No flank tenderness. Musculoskeletal: Extremities warm and well perfused.  Neurologic:  Normal speech and language. No gross focal neurologic deficits are appreciated.  Skin:  Skin is warm and dry. No rash noted. Psychiatric: Mood and affect are normal. Speech and behavior are normal.  ____________________________________________   LABS (all labs ordered are listed, but only abnormal results are displayed)  Labs Reviewed  COMPREHENSIVE METABOLIC PANEL - Abnormal; Notable for the following components:      Result Value   Glucose, Bld 124 (*)    All other components within normal limits  CBC - Abnormal; Notable for the following components:   WBC 14.3 (*)    All other components within normal limits  SARS CORONAVIRUS 2  LIPASE, BLOOD   ____________________________________________  EKG   ____________________________________________  RADIOLOGY  CT abdomen: Dilated small bowel loops consistent with enteritis versus obstruction, with no obvious transition point  ____________________________________________   PROCEDURES  Procedure(s) performed: No  Procedures  Critical Care performed: No ____________________________________________   INITIAL IMPRESSION / ASSESSMENT AND PLAN / ED COURSE  Pertinent labs & imaging results that were available during my care of the patient were reviewed by me and considered in my medical decision making (see chart for details).   78 year old male with PMH as noted above presents with acute onset of nausea and vomiting as well as diffuse abdominal pain approximately 6 hours ago, occurring 2 hours after a meal at a restaurant.  He has not had any diarrhea.  The patient is concerned about a recent Salmonella outbreak involving onions.  On exam he is relatively well-appearing.  His vital signs are normal except for hypertension.  He has mild to moderate abdominal tenderness both upper and lower but no rigidity or peritoneal signs.  Overall I suspect most likely early gastroenteritis versus foodborne illness.  I have a low suspicion for Salmonella given that he has no diarrhea.  Given his age and the abdominal tenderness we will obtain a CT to rule out colitis, obstruction, or other concerning acute process, obtain labs, and reassess.  ----------------------------------------- 5:09 AM on 07/22/2019 -----------------------------------------  The CT showed dilated small bowel loops with no obvious transition point.  This is concerning for small bowel obstruction versus possible enteritis.  I consulted Dr. Tonna BoehringerSakai from  general surgery who came to evaluate the patient and recommends NG tube placement and admission to his service for further monitoring.  I discussed the results with the patient and he agrees with the plan of care.  _________________________  Lance Mcclure was evaluated in Emergency Department on 07/22/2019 for the symptoms described in the history of present illness. He was evaluated in the context of the global COVID-19 pandemic, which necessitated consideration that the patient might be at risk for infection with the SARS-CoV-2 virus that causes COVID-19. Institutional protocols and algorithms that pertain to the evaluation of patients at risk for COVID-19 are in a state of rapid change based on information released by regulatory bodies including the CDC and federal and state organizations. These policies and algorithms  were followed during the patient's care in the ED.  ____________________________________________   FINAL CLINICAL IMPRESSION(S) / ED DIAGNOSES  Final diagnoses:  Small bowel obstruction (HCC)      NEW MEDICATIONS STARTED DURING THIS VISIT:  New Prescriptions   No medications on file     Note:  This document was prepared using Dragon voice recognition software and may include unintentional dictation errors.    Dionne BucySiadecki, Jadore Veals, MD 07/22/19 [phone]

## 2019-07-22 NOTE — H&P (Signed)
Subjective:   CC: SBO  HPI:  Lance Mcclure is a 78 y.o. male who was consulted by [facility] for issue above.  Symptoms were first noted several hours ago. Pain is sharp, increasing, all four quadrants.  Associated with N/V, exacerbated by nothing specific.  Started shortly after eating at the [employer].  No other recent sick contacts.  Last BM was 24hrs ago, usually has daily BM.  Denies passing flatus overnight.     Past Medical History:  has a past medical history of Hyperlipidemia and Hypertension.  Past Surgical History:  Past Surgical History:  Procedure Laterality Date  . APPENDECTOMY    . BLADDER SURGERY    . CHOLECYSTECTOMY    . FOOT SURGERY Left   . HERNIA REPAIR    . RETINAL DETACHMENT SURGERY    OPEN  Family History: family history includes Cancer in his mother; Stroke in his father.  Social History:  reports that he has quit smoking. He has never used smokeless tobacco. He reports that he does not drink alcohol or use drugs.  Current Medications:  azelastine (ASTELIN) 0.1 % nasal spray Place 1 spray into both nostrils 2 (two) times daily as needed for rhinitis or allergies. Lysle Pearl, Thijs Brunton, DO Not Ordered  Biotin 5 MG TABS Take 5 mg by mouth daily.  Lysle Pearl, Justan Gaede, DO Not Ordered  calcium-vitamin D (OSCAL WITH D) 500-200 MG-UNIT tablet Take 1 tablet by mouth daily. Lysle Pearl, Legrande Hao, DO Not Ordered  Coenzyme Q10 200 MG TABS Take 200 mg by mouth daily.  Lysle Pearl, Lyrick Lagrand, DO Not Ordered  Glucosamine-Chondroit-Vit C-Mn (GLUCOSAMINE CHONDR 1500 COMPLX) CAPS Take 1 capsule by mouth daily. Lysle Pearl, Detta Mellin, DO Not Ordered  lisinopril (PRINIVIL,ZESTRIL) 40 MG tablet Take 40 mg by mouth at bedtime.  Lysle Pearl, Kincaid Tiger, DO Reordered  Ordered as: lisinopril (ZESTRIL) tablet 40 mg - 40 mg, Oral, Daily at bedtime, First dose on Wed 07/22/19 at 2200  Multiple Vitamins-Minerals (MULTIVITAMIN ADULTS 50+ PO) Take 1 tablet by mouth daily.  Lysle Pearl, Korion Cuevas, DO Not Ordered  simvastatin (ZOCOR) 20 MG  tablet Take 20 mg by mouth at bedtime. Benjamine Sprague, DO Not Ordered  vitamin B-12 (CYANOCOBALAMIN) 1000 MCG tablet Take 1,000 mcg by mouth daily. Benjamine Sprague, DO Not Ordered     Allergies:  Allergies as of 07/21/2019  . (No Known Allergies)    ROS:  General: Denies weight loss, weight gain, fatigue, fevers, chills, and night sweats. Eyes: Denies blurry vision, double vision, eye pain, itchy eyes, and tearing. Ears: Denies hearing loss, earache, and ringing in ears. Nose: Denies sinus pain, congestion, infections, runny nose, and nosebleeds. Mouth/throat: Denies hoarseness, sore throat, bleeding gums, and difficulty swallowing. Heart: Denies chest pain, palpitations, racing heart, irregular heartbeat, leg pain or swelling, and decreased activity tolerance. Respiratory: Denies breathing difficulty, shortness of breath, wheezing, cough, and sputum. GI: Denies change in appetite, heartburn, constipation, diarrhea, and blood in stool. GU: Denies difficulty urinating, pain with urinating, urgency, frequency, blood in urine. Musculoskeletal: Denies joint stiffness, pain, swelling, muscle weakness. Skin: Denies rash, itching, mass, tumors, sores, and boils Neurologic: Denies headache, fainting, dizziness, seizures, numbness, and tingling. Psychiatric: Denies depression, anxiety, difficulty sleeping, and memory loss. Endocrine: Denies heat or cold intolerance, and increased thirst or urination. Blood/lymph: Denies easy bruising, easy bruising, and swollen glands     Objective:     BP (!) 149/76   Pulse 67   Temp 97.6 F (36.4 C) (Oral)   Resp 17   Ht 5\' 6"  (1.676 m)  Wt 68 kg   SpO2 98%   BMI 24.21 kg/m   Constitutional :  alert, cooperative, appears stated age and no distress  Lymphatics/Throat:  no asymmetry, masses, or scars  Respiratory:  clear to auscultation bilaterally  Cardiovascular:  regular rate and rhythm  Gastrointestinal: Soft, no guarding, but reports tenderness  in all 4 quadrants, nondistended, no rebound tenderness.   Musculoskeletal: Steady gait and movement  Skin: Cool and moist, paramedian surgical scar on right side  Psychiatric: Normal affect, non-agitated, not confused       LABS:  CMP Latest Ref Rng & Units 07/21/2019  Glucose 70 - 99 mg/dL 161(W124(H)  BUN 8 - 23 mg/dL 20  Creatinine 9.600.61 - 4.541.24 mg/dL 0.981.17  Sodium 119135 - 147145 mmol/L 140  Potassium 3.5 - 5.1 mmol/L 4.0  Chloride 98 - 111 mmol/L 102  CO2 22 - 32 mmol/L 24  Calcium 8.9 - 10.3 mg/dL 9.5  Total Protein 6.5 - 8.1 g/dL 7.4  Total Bilirubin 0.3 - 1.2 mg/dL 1.1  Alkaline Phos 38 - 126 U/L 38  AST 15 - 41 U/L 25  ALT 0 - 44 U/L 22   CBC Latest Ref Rng & Units 07/21/2019  WBC 4.0 - 10.5 K/uL 14.3(H)  Hemoglobin 13.0 - 17.0 g/dL 82.914.8  Hematocrit 56.239.0 - 52.0 % 43.3  Platelets 150 - 400 K/uL 236    RADS: CLINICAL DATA:  Abdominal pain for several hours  EXAM: CT ABDOMEN AND PELVIS WITH CONTRAST  TECHNIQUE: Multidetector CT imaging of the abdomen and pelvis was performed using the standard protocol following bolus administration of intravenous contrast.  CONTRAST:  100mL OMNIPAQUE IOHEXOL 300 MG/ML  SOLN  COMPARISON:  None.  FINDINGS: Lower chest: No acute abnormality. Changes of prior granulomatous disease are noted.  Hepatobiliary: Gallbladder has been surgically removed. Biliary ductal dilatation is seen. Scattered cysts are noted throughout the liver.  Pancreas: Unremarkable. No pancreatic ductal dilatation or surrounding inflammatory changes.  Spleen: Normal in size without focal abnormality.  Adrenals/Urinary Tract: Adrenal glands are within normal limits bilaterally. Scattered cysts are seen. No renal calculi or obstructive changes. Bladder is partially distended.  Stomach/Bowel: Scattered diverticular change of the colon is noted without evidence of diverticulitis. The appendix is not visualized consistent with a prior surgical history.  Multiple fluid-filled loops of distal small bowel are seen. No definitive transition zone is seen. Changes may represent a mild enteritis or very early partial small bowel obstruction  Vascular/Lymphatic: Aortic atherosclerosis. No enlarged abdominal or pelvic lymph nodes.  Reproductive: Prostate is unremarkable.  Other: No abdominal wall hernia or abnormality. No abdominopelvic ascites.  Musculoskeletal: Degenerative changes of the lumbar spine are noted.  IMPRESSION: Multiple fluid-filled mildly dilated loops of distal small bowel consistent with enteritis or early small-bowel obstruction. No definitive transition zone is seen.  Diverticulosis without diverticulitis.  Chronic changes as described above.   Electronically Signed   By: Alcide CleverMark  Lukens M.D.   On: 07/22/2019 02:01 Assessment:   SBO vs gastroenteritis Hypertension  Plan:    Persistent nausea and pain, with CT concerning for at minimum partial small bowel obstruction versus gastroenteritis.  Bowels are dilated and reviewed the CT by myself notes possible areas of decompressed bowel as well.  Will admit for NG tube decompression secondary to the distended stomach noted on CT, and will order a small bowel follow-through to see if a definitive transition point can be visualized.  Keep n.p.o. for bowel rest.   Hypertension will be controlled with home  meds and hydralazine as needed.  Lovenox for DVT prophylaxis.  Protonix for GI prophylaxis.

## 2019-07-22 NOTE — Progress Notes (Signed)
Pt. arrived to the unit via stretcher. Alert and oriented. NGT placed w/o difficulty. Radiology on the unit obtain xray. Waiting on results to verify placement. See MAR.

## 2019-07-22 NOTE — Plan of Care (Signed)
Pt admitted w/small bowel obstruction.  Nausea, vomiting and abd pain.  NG tube placed and plan for gastro graphing.

## 2019-07-22 NOTE — ED Notes (Signed)
Patient transported to CT 

## 2019-07-22 NOTE — ED Notes (Signed)
ED TO INPATIENT HANDOFF REPORT  ED Nurse Name and Phone #:  Lance Mcclure [phone]  S Name/Age/Gender Lance Mcclure 78 y.o. male Room/Bed: ED04A/ED04A  Code Status   Code Status: Not on file  Home/SNF/Other Home Patient oriented to: self, place, time and situation Is this baseline? Yes   Triage Complete: Triage complete  Chief Complaint abd pain vomiting  Triage Note Pt arrives to ED via POV from home with c/o abdominal pain and N/V since 4pm today. Pt reports the abdominal is generalized without radiation. Pt states the s/x's started after eating at the [employer], having some soup and states he has eaten onions and concerned d/t the recent GI outbreak associated with onions in the news. Pt reports 5 episodes on non-bloody emesis since the s/x's started. Pt reports having his gall bladder and appendix surgically removed in the past.   Allergies No Known Allergies  Level of Care/Admitting Diagnosis ED Disposition    ED Disposition Condition Comment   Admit  Hospital Area: [facility] [[id number]]  Level of Care: Med-Surg [16]  Covid Evaluation: Asymptomatic Screening Protocol (No Symptoms)  Diagnosis: SBO (small bowel obstruction) [facility](HCC) [[id number]]  Admitting Physician: Sung AmabileSAKAI, ISAMI [[id number]]  Attending Physician: Sung AmabileSAKAI, ISAMI [[id number]]  Estimated length of stay: past midnight tomorrow  Certification:: I certify this patient will need inpatient services for at least 2 midnights  PT Class (Do Not Modify): Inpatient [101]  PT Acc Code (Do Not Modify): Private [1]       B Medical/Surgery History Past Medical History:  Diagnosis Date  . Hyperlipidemia   . Hypertension    Past Surgical History:  Procedure Laterality Date  . APPENDECTOMY    . BLADDER SURGERY    . CHOLECYSTECTOMY    . FOOT SURGERY Left   . HERNIA REPAIR    . RETINAL DETACHMENT SURGERY       A IV Location/Drains/Wounds Patient Lines/Drains/Airways Status   Active  Line/Drains/Airways    Name:   Placement date:   Placement time:   Site:   Days:   Peripheral IV 07/22/19 Left Antecubital   07/22/19    0039    Antecubital   less than 1          Intake/Output Last 24 hours  Intake/Output Summary (Last 24 hours) at 07/22/2019 0504 Last data filed at 07/22/2019 0350 Gross per 24 hour  Intake 1000 ml  Output -  Net 1000 ml    Labs/Imaging Results for orders placed or performed during the hospital encounter of 07/21/19 (from the past 48 hour(s))  Lipase, blood     Status: None   Collection Time: 07/21/19 10:36 PM  Result Value Ref Range   Lipase 29 11 - 51 U/L    Comment: Performed at [facility], [address] [place], [place] [address]  Comprehensive metabolic panel     Status: Abnormal   Collection Time: 07/21/19 10:36 PM  Result Value Ref Range   Sodium 140 135 - 145 mmol/L   Potassium 4.0 3.5 - 5.1 mmol/L   Chloride 102 98 - 111 mmol/L   CO2 24 22 - 32 mmol/L   Glucose, Bld 124 (H) 70 - 99 mg/dL   BUN 20 8 - 23 mg/dL   Creatinine, Ser 5.951.17 0.61 - 1.24 mg/dL   Calcium 9.5 8.9 - 63.810.3 mg/dL   Total Protein 7.4 6.5 - 8.1 g/dL   Albumin 4.8 3.5 - 5.0 g/dL   AST 25 15 - 41 U/L  ALT 22 0 - 44 U/L   Alkaline Phosphatase 38 38 - 126 U/L   Total Bilirubin 1.1 0.3 - 1.2 mg/dL   GFR calc non Af Amer >60 >60 mL/min   GFR calc Af Amer >60 >60 mL/min   Anion gap 14 5 - 15    Comment: Performed at [facility], [address] [place], [place] [address]  CBC     Status: Abnormal   Collection Time: 07/21/19 10:36 PM  Result Value Ref Range   WBC 14.3 (H) 4.0 - 10.5 K/uL   RBC 4.65 4.22 - 5.81 MIL/uL   Hemoglobin 14.8 13.0 - 17.0 g/dL   HCT 11.943.3 14.739.0 - 82.952.0 %   MCV 93.1 80.0 - 100.0 fL   MCH 31.8 26.0 - 34.0 pg   MCHC 34.2 30.0 - 36.0 g/dL   RDW 56.213.1 13.011.5 - 86.515.5 %   Platelets 236 150 - 400 K/uL   nRBC 0.0 0.0 - 0.2 %    Comment: Performed at [facility], [address] [place], [place] [address]   Ct  Abdomen Pelvis W Contrast  Result Date: 07/22/2019 CLINICAL DATA:  Abdominal pain for several hours EXAM: CT ABDOMEN AND PELVIS WITH CONTRAST TECHNIQUE: Multidetector CT imaging of the abdomen and pelvis was performed using the standard protocol following bolus administration of intravenous contrast. CONTRAST:  100mL OMNIPAQUE IOHEXOL 300 MG/ML  SOLN COMPARISON:  None. FINDINGS: Lower chest: No acute abnormality. Changes of prior granulomatous disease are noted. Hepatobiliary: Gallbladder has been surgically removed. Biliary ductal dilatation is seen. Scattered cysts are noted throughout the liver. Pancreas: Unremarkable. No pancreatic ductal dilatation or surrounding inflammatory changes. Spleen: Normal in size without focal abnormality. Adrenals/Urinary Tract: Adrenal glands are within normal limits bilaterally. Scattered cysts are seen. No renal calculi or obstructive changes. Bladder is partially distended. Stomach/Bowel: Scattered diverticular change of the colon is noted without evidence of diverticulitis. The appendix is not visualized consistent with a prior surgical history. Multiple fluid-filled loops of distal small bowel are seen. No definitive transition zone is seen. Changes may represent a mild enteritis or very early partial small bowel obstruction Vascular/Lymphatic: Aortic atherosclerosis. No enlarged abdominal or pelvic lymph nodes. Reproductive: Prostate is unremarkable. Other: No abdominal wall hernia or abnormality. No abdominopelvic ascites. Musculoskeletal: Degenerative changes of the lumbar spine are noted. IMPRESSION: Multiple fluid-filled mildly dilated loops of distal small bowel consistent with enteritis or early small-bowel obstruction. No definitive transition zone is seen. Diverticulosis without diverticulitis. Chronic changes as described above. Electronically Signed   By: Alcide CleverMark  Lukens M.D.   On: 07/22/2019 02:01    Pending Labs Unresulted Labs (From admission, onward)    Start      Ordered   07/22/19 0243  SARS CORONAVIRUS 2 Nasal Swab Aptima Multi Swab  (Asymptomatic/Tier 2 Patients Labs)  Once,   STAT    Question Answer Comment  Is this test for diagnosis or screening Screening   Symptomatic for COVID-19 as defined by CDC No   Hospitalized for COVID-19 No   Admitted to ICU for COVID-19 No   Previously tested for COVID-19 No   Resident in a congregate (group) care setting No   Employed in healthcare setting No      07/22/19 0242   Signed and Held  Basic metabolic panel  Daily,   R     Signed and Held   Signed and Held  Magnesium  Daily,   R     Signed and Held  Signed and Held  Phosphorus  Daily,   R     Signed and Held   Signed and Held  CBC  Daily,   R     Signed and Held          Vitals/Pain Today's Vitals   07/22/19 0330 07/22/19 0346 07/22/19 0451 07/22/19 0500  BP: (!) 141/85   140/75  Pulse: 74   63  Resp: (!) 24   15  Temp:      TempSrc:      SpO2: 93%   100%  Weight:      Height:      PainSc:  9  7      Isolation Precautions No active isolations  Medications Medications  ondansetron (ZOFRAN-ODT) disintegrating tablet 4 mg (4 mg Oral Given 07/21/19 2225)  sodium chloride 0.9 % bolus 1,000 mL (0 mLs Intravenous Stopped 07/22/19 0350)  morphine 4 MG/ML injection 4 mg (4 mg Intravenous Given 07/22/19 0043)  ondansetron (ZOFRAN) injection 4 mg (4 mg Intravenous Given 07/22/19 0042)  iohexol (OMNIPAQUE) 300 MG/ML solution 100 mL (100 mLs Intravenous Contrast Given 07/22/19 0129)  morphine 4 MG/ML injection 4 mg (4 mg Intravenous Given 07/22/19 0347)    Mobility walks Low fall risk   Focused Assessments Nausea/diarrhea/vomiting   R Recommendations: See Admitting Provider Note  Report given to:   Additional Notes:

## 2019-07-23 LAB — BASIC METABOLIC PANEL
Anion gap: 8 (ref 5–15)
BUN: 20 mg/dL (ref 8–23)
CO2: 25 mmol/L (ref 22–32)
Calcium: 8.6 mg/dL — ABNORMAL LOW (ref 8.9–10.3)
Chloride: 108 mmol/L (ref 98–111)
Creatinine, Ser: 0.97 mg/dL (ref 0.61–1.24)
GFR calc Af Amer: >60 mL/min (ref >60)
GFR calc non Af Amer: >60 mL/min (ref >60)
Glucose, Bld: 108 mg/dL — ABNORMAL HIGH (ref 70–99)
Potassium: 4.1 mmol/L (ref 3.5–5.1)
Sodium: 141 mmol/L (ref 135–145)

## 2019-07-23 LAB — CBC
HCT: 44.5 % (ref 39.0–52.0)
Hemoglobin: 14.8 g/dL (ref 13.0–17.0)
MCH: 32.2 pg (ref 26.0–34.0)
MCHC: 33.3 g/dL (ref 30.0–36.0)
MCV: 96.9 fL (ref 80.0–100.0)
Platelets: 214 10*3/uL (ref 150–400)
RBC: 4.59 MIL/uL (ref 4.22–5.81)
RDW: 13.7 % (ref 11.5–15.5)
WBC: 11 10*3/uL — ABNORMAL HIGH (ref 4.0–10.5)
nRBC: 0 % (ref 0.0–0.2)

## 2019-07-23 LAB — MAGNESIUM: Magnesium: 2.2 mg/dL (ref 1.7–2.4)

## 2019-07-23 LAB — PHOSPHORUS: Phosphorus: 3.7 mg/dL (ref 2.5–4.6)

## 2019-07-23 MED ORDER — PANTOPRAZOLE SODIUM 40 MG PO TBEC
40.0000 mg | DELAYED_RELEASE_TABLET | Freq: Every day | ORAL | Status: DC
  Administered 2019-07-23: 21:00:00 40 mg via ORAL
  Filled 2019-07-23: qty 1

## 2019-07-23 NOTE — Progress Notes (Signed)
Subjective:  CC: Lance Mcclure is a 78 y.o. male  Hospital stay day 1,   SBO  HPI: No acute issues.  Pain and nausea improved. Positive flatus, no BM.  ROS:  General: Denies weight loss, weight gain, fatigue, fevers, chills, and night sweats. Heart: Denies chest pain, palpitations, racing heart, irregular heartbeat, leg pain or swelling, and decreased activity tolerance. Respiratory: Denies breathing difficulty, shortness of breath, wheezing, cough, and sputum. GI: Denies change in appetite, heartburn, nausea, vomiting, constipation, diarrhea, and blood in stool. GU: Denies difficulty urinating, pain with urinating, urgency, frequency, blood in urine.   Objective:   Temp:  [98 F (36.7 C)] 98 F (36.7 C) (08/05 2242) Pulse Rate:  [65-69] 69 (08/05 2242) Resp:  [15-18] 18 (08/05 2242) BP: (143-165)/(82-88) 143/82 (08/05 2242) SpO2:  [98 %-100 %] 98 % (08/05 2242)     Height: 5\' 6"  (167.6 cm) Weight: 66 kg BMI (Calculated): 23.5   Intake/Output this shift:   Intake/Output Summary (Last 24 hours) at 07/23/2019 0758 Last data filed at 07/23/2019 0605 Gross per 24 hour  Intake 1508.92 ml  Output 900 ml  Net 608.92 ml    Constitutional :  alert, cooperative, appears stated age and no distress  Respiratory:  clear to auscultation bilaterally  Cardiovascular:  regular rate and rhythm  Gastrointestinal: soft, non-tender; bowel sounds normal; no masses,  no organomegaly.   Skin: Cool and moist.   Psychiatric: Normal affect, non-agitated, not confused       LABS:  CMP Latest Ref Rng & Units 07/23/2019 07/21/2019  Glucose 70 - 99 mg/dL 108(H) 124(H)  BUN 8 - 23 mg/dL 20 20  Creatinine 0.61 - 1.24 mg/dL 0.97 1.17  Sodium 135 - 145 mmol/L 141 140  Potassium 3.5 - 5.1 mmol/L 4.1 4.0  Chloride 98 - 111 mmol/L 108 102  CO2 22 - 32 mmol/L 25 24  Calcium 8.9 - 10.3 mg/dL 8.6(L) 9.5  Total Protein 6.5 - 8.1 g/dL - 7.4  Total Bilirubin 0.3 - 1.2 mg/dL - 1.1  Alkaline Phos 38 - 126 U/L -  38  AST 15 - 41 U/L - 25  ALT 0 - 44 U/L - 22   CBC Latest Ref Rng & Units 07/23/2019 07/21/2019  WBC 4.0 - 10.5 K/uL 11.0(H) 14.3(H)  Hemoglobin 13.0 - 17.0 g/dL 14.8 14.8  Hematocrit 39.0 - 52.0 % 44.5 43.3  Platelets 150 - 400 K/uL 214 236    RADS: CLINICAL DATA:  Small-bowel obstruction. 8 hour delayed image following administration of oral contrast through the patient's nasogastric tube.  EXAM: PORTABLE ABDOMEN - 1 VIEW  COMPARISON:  Earlier today.  FINDINGS: Oral contrast pooled in the gastric fundus. The nasogastric tube and side hole are in the proximal stomach. Multiple mildly dilated loops of proximal and mid small bowel with progression. No gross free peritoneal air seen. Excreted contrast in the urinary bladder. Lumbar spine degenerative changes. Pelvic hernia repair mesh anchors.  IMPRESSION: 1. Mildly progressive partial small bowel obstruction or ileus. 2. The administered oral contrast is pooled in the gastric fundus. The patient will need to be placed in a right lateral decubitus position with additional administration of contrast to assess for bowel obstruction.   Electronically Signed   By: Claudie Revering M.D.   On: 07/22/2019 22:28 Assessment:   SBO, seems to be resolving clinically, wbc decreasing.  Will clamp trial and if pass, will advance to clears today and continue to monitor

## 2019-07-23 NOTE — Care Management Important Message (Signed)
Important Message  Patient Details  Name: Lance Mcclure MRN: [id number] Date of Birth: 1940-12-24   Medicare Important Message Given:  Yes  Initial Medicare IM given by Patient Access Associate on 07/22/2019 at 10:35am.  Still valid.    Dannette Barbara 07/23/2019, 5:05 PM

## 2019-07-23 NOTE — Progress Notes (Signed)
PHARMACIST - PHYSICIAN COMMUNICATION  CONCERNING: IV to Oral Route Change Policy  RECOMMENDATION: This patient is receiving pantoprazole by the intravenous route.  Based on criteria approved by the Pharmacy and Therapeutics Committee, the intravenous medication(s) is/are being converted to the equivalent oral dose form(s).   DESCRIPTION: These criteria include:  The patient is eating (either orally or via tube) and/or has been taking other orally administered medications for a least 24 hours  The patient has no evidence of active gastrointestinal bleeding or impaired GI absorption (gastrectomy, short bowel, patient on TNA or NPO).  If you have questions about this conversion, please contact the Pharmacy Department   [phone] )  [place], [facility] 07/23/2019 3:53 PM

## 2019-07-24 LAB — MAGNESIUM: Magnesium: 2.2 mg/dL (ref 1.7–2.4)

## 2019-07-24 LAB — BASIC METABOLIC PANEL
Anion gap: 7 (ref 5–15)
BUN: 16 mg/dL (ref 8–23)
CO2: 28 mmol/L (ref 22–32)
Calcium: 8.7 mg/dL — ABNORMAL LOW (ref 8.9–10.3)
Chloride: 104 mmol/L (ref 98–111)
Creatinine, Ser: 1.05 mg/dL (ref 0.61–1.24)
GFR calc Af Amer: >60 mL/min (ref >60)
GFR calc non Af Amer: >60 mL/min (ref >60)
Glucose, Bld: 101 mg/dL — ABNORMAL HIGH (ref 70–99)
Potassium: 4 mmol/L (ref 3.5–5.1)
Sodium: 139 mmol/L (ref 135–145)

## 2019-07-24 LAB — CBC
HCT: 45.1 % (ref 39.0–52.0)
Hemoglobin: 15.3 g/dL (ref 13.0–17.0)
MCH: 32.1 pg (ref 26.0–34.0)
MCHC: 33.9 g/dL (ref 30.0–36.0)
MCV: 94.5 fL (ref 80.0–100.0)
Platelets: 201 10*3/uL (ref 150–400)
RBC: 4.77 MIL/uL (ref 4.22–5.81)
RDW: 13.2 % (ref 11.5–15.5)
WBC: 9.6 10*3/uL (ref 4.0–10.5)
nRBC: 0 % (ref 0.0–0.2)

## 2019-07-24 LAB — PHOSPHORUS: Phosphorus: 3 mg/dL (ref 2.5–4.6)

## 2019-07-24 NOTE — Discharge Instructions (Signed)
Bowel Obstruction °A bowel obstruction means that something is blocking the small or large bowel. The bowel is also called the intestine. It is the long tube that connects the stomach to the opening of the butt (anus). When something blocks the bowel, food and fluids cannot pass through like normal. This condition needs to be treated. Treatment depends on the cause of the problem and how bad the problem is. °What are the causes? °Common causes of this condition include: °· Scar tissue (adhesions) from past surgery or from high-energy X-rays (radiation). °· Recent surgery in the belly. This affects how food moves in the bowel. °· Some diseases, such as: °? Irritation of the lining of the digestive tract (Crohn's disease). °? Irritation of small pouches in the bowel (diverticulitis). °· Growths or tumors. °· A bulging organ (hernia). °· Twisting of the bowel (volvulus). °· A foreign body. °· Slipping of a part of the bowel into another part (intussusception). °What are the signs or symptoms? °Symptoms of this condition include: °· Pain in the belly. °· Feeling sick to your stomach (nauseous). °· Throwing up (vomiting). °· Bloating in the belly. °· Being unable to pass gas. °· Trouble pooping (constipation). °· Watery poop (diarrhea). °· A lot of belching. °How is this diagnosed? °This condition may be diagnosed based on: °· A physical exam. °· Medical history. °· Imaging tests, such as X-ray or CT scan. °· Blood tests. °· Urine tests. °How is this treated? °Treatment for this condition may include: °· Fluids and pain medicines that are given through an IV tube. Your doctor may tell you not to eat or drink if you feel sick to your stomach and are throwing up. °· Eating a clear liquid diet for a few days. °· Putting a small tube (nasogastric tube) into the stomach. This will help with pain, discomfort, and nausea by removing blocked air and fluids from the stomach. °· Surgery. This may be needed if other treatments do  not work. °Follow these instructions at home: °Medicines °· Take over-the-counter and prescription medicines only as told by your doctor. °· If you were prescribed an antibiotic medicine, take it as told by your doctor. Do not stop taking the antibiotic even if you start to feel better. °General instructions °· Follow your diet as told by your doctor. You may need to: °? Only drink clear liquids until you start to get better. °? Avoid solid foods. °· Return to your normal activities as told by your doctor. Ask your doctor what activities are safe for you. °· Do not sit for a long time without moving. Get up to take short walks every 1-2 hours. This is important. Ask for help if you feel weak or unsteady. °· Keep all follow-up visits as told by your doctor. This is important. °How is this prevented? °After having a bowel obstruction, you may be more likely to have another. You can do some things to stop it from happening again. °· If you have a long-term (chronic) disease, contact your doctor if you see changes or problems. °· Take steps to prevent or treat trouble pooping. Your doctor may ask that you: °? Drink enough fluid to keep your pee (urine) pale yellow. °? Take over-the-counter or prescription medicines. °? Eat foods that are high in fiber. These include beans, whole grains, and fresh fruits and vegetables. °? Limit foods that are high in fat and sugar. These include fried or sweet foods. °· Stay active. Ask your doctor which exercises are   safe for you. °· Avoid stress. °· Eat three small meals and three small snacks each day. °· Work with a food expert (dietitian) to make a meal plan that works for you. °· Do not use any products that contain nicotine or tobacco, such as cigarettes and e-cigarettes. If you need help quitting, ask your doctor. °Contact a doctor if: °· You have a fever. °· You have chills. °Get help right away if: °· You have pain or cramps that get worse. °· You throw up blood. °· You are  sick to your stomach. °· You cannot stop throwing up. °· You cannot drink fluids. °· You feel mixed up (confused). °· You feel very thirsty (dehydrated). °· Your belly gets more bloated. °· You feel weak or you pass out (faint). °Summary °· A bowel obstruction means that something is blocking the small or large bowel. °· Treatment may include IV fluids and pain medicine. You may also have a clear liquid diet, a small tube in your stomach, or surgery. °· Drink clear liquids and avoid solid foods until you get better. °This information is not intended to replace advice given to you by your health care provider. Make sure you discuss any questions you have with your health care provider. °Document Released: 01/10/2005 Document Revised: 04/16/2018 Document Reviewed: 04/16/2018 °Elsevier Patient Education © 2020 [employer] ° °

## 2019-07-24 NOTE — Discharge Summary (Signed)
Physician Discharge Summary  Patient ID: Lance Mcclure MRN: [id number] DOB/AGE: Oct 18, 1941 78 y.o.  Admit date: 07/21/2019 Discharge date: 07/24/2019  Admission Diagnoses: SBO  Discharge Diagnoses:  Same as above  Discharged Condition: good  Hospital Course: Admitted for above diagnosis.  NG tube placed and bowel rest started.  Pain and distention improved, and started passing flatus.  Passed NG clamp trial and resume diet and advance as tolerated.  At time of discharge patient remains asymptomatic tolerated to full regular meals and still consistently passing flatus at this time.  Consults: None  Discharge Exam: Blood pressure 119/78, pulse 79, temperature 98.2 F (36.8 C), temperature source Oral, resp. rate 19, height 5\' 6"  (1.676 m), weight 66 kg, SpO2 97 %. General appearance: alert, cooperative and no distress GI: soft, non-tender; bowel sounds normal; no masses,  no organomegaly  Disposition:  Discharge disposition: 01-Home or Self Care       Discharge Instructions    Discharge patient   Complete by: As directed    Discharge disposition: 01-Home or Self Care   Discharge patient date: 07/24/2019     Allergies as of 07/24/2019   No Known Allergies     Medication List    TAKE these medications   azelastine 0.1 % nasal spray Commonly known as: ASTELIN Place 1 spray into both nostrils 2 (two) times daily as needed for rhinitis or allergies.   Biotin 5 MG Tabs Take 5 mg by mouth daily.   calcium-vitamin D 500-200 MG-UNIT tablet Commonly known as: OSCAL WITH D Take 1 tablet by mouth daily.   Coenzyme Q10 200 MG Tabs Take 200 mg by mouth daily.   Glucosamine Chondr 1500 Complx Caps Take 1 capsule by mouth daily.   lisinopril 40 MG tablet Commonly known as: ZESTRIL Take 40 mg by mouth at bedtime.   MULTIVITAMIN ADULTS 50+ PO Take 1 tablet by mouth daily.   simvastatin 20 MG tablet Commonly known as: ZOCOR Take 20 mg by mouth at bedtime.   vitamin  B-12 1000 MCG tablet Commonly known as: CYANOCOBALAMIN Take 1,000 mcg by mouth daily.         Total time spent arranging discharge was >24min. Signed: Benjamine Sprague 07/24/2019, 1:47 PM

## 2019-07-24 NOTE — Care Management Important Message (Signed)
Important Message  Patient Details  Name: Lance Mcclure MRN: [id number] Date of Birth: [birth year]   Medicare Important Message Given:  Yes     Juliann Pulse A Deantae Shackleton 07/24/2019, 10:32 AM

## 2019-07-24 NOTE — Progress Notes (Signed)
Discharge summary reviewed with verbal understanding. Answered all questions.
